# Patient Record
Sex: Male | Born: 1995
Health system: Southern US, Community
[De-identification: ages and names within clinical notes are randomized; demographics above are authoritative.]

## PROBLEM LIST (undated history)

## (undated) DIAGNOSIS — F909 Attention-deficit hyperactivity disorder, unspecified type: Secondary | ICD-10-CM

## (undated) DIAGNOSIS — M5124 Other intervertebral disc displacement, thoracic region: Secondary | ICD-10-CM

## (undated) DIAGNOSIS — F419 Anxiety disorder, unspecified: Secondary | ICD-10-CM

## (undated) HISTORY — DX: Attention-deficit hyperactivity disorder, unspecified type: F90.9

## (undated) HISTORY — PX: CIRCUMCISION: SHX1350

## (undated) HISTORY — DX: Other intervertebral disc displacement, thoracic region: M51.24

## (undated) HISTORY — DX: Anxiety disorder, unspecified: F41.9

---
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
## 2015-01-22 ENCOUNTER — Other Ambulatory Visit: Payer: Self-pay

## 2015-01-22 DIAGNOSIS — G250 Essential tremor: Secondary | ICD-10-CM

## 2015-01-22 DIAGNOSIS — G252 Other specified forms of tremor: Principal | ICD-10-CM

## 2015-01-22 MED ORDER — PROPRANOLOL HCL 10 MG PO TABS: ORAL_TABLET | ORAL | Status: DC

## 2015-02-14 ENCOUNTER — Ambulatory Visit (INDEPENDENT_AMBULATORY_CARE_PROVIDER_SITE_OTHER): Payer: BLUE CROSS/BLUE SHIELD | Admitting: Pediatrics

## 2015-02-14 ENCOUNTER — Encounter: Payer: Self-pay | Admitting: Pediatrics

## 2015-02-14 VITALS — BP 116/72 | HR 84 | Ht 70.5 in | Wt 158.4 lb

## 2015-02-14 DIAGNOSIS — G250 Essential tremor: Secondary | ICD-10-CM | POA: Diagnosis not present

## 2015-02-14 MED ORDER — PROPRANOLOL HCL 10 MG PO TABS: ORAL_TABLET | ORAL | Status: DC

## 2015-02-14 NOTE — Progress Notes (Signed)
Patient: Kent Miller MRN: [id number] Sex: male DOB: Apr 30, 1996  Provider: Deetta PerlaHICKLING,Donathan Buller H, MD [facility]  Note type: Routine return visit  History of Present Illness: Referral Source: Cynda AcresMarybeth Myers, MD [facility] Chief Complaint: ADHD/ Essential Tremor/ODD  Kent Savannahaylor Esterline is a 19 y.o. male who returns February 14, 2015, for the first time since January 17, 2014.  Kent Miller has essential tremor and a past history of attention deficit hyperactivity disorder. 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  He has family and friends who have been welders.  This is a two year program and he has good job prospects when he finishes.    Currently, he works part-time for a [employer] and was allowed time off in order to go to school.  He lives at home with his parents.  He believes that the current dose of propranolol, which is only 5 mg in the morning it is controlling his tremor to the extent that it needs to.  He does not wish higher doses.  He also has attention deficit disorder, which has been treated with Strattera by his primary physician.  His general health has been good.  He is sleeping well and his appetite is fine.  He has gained 12 pounds since his last visit, but looks well.  Review of Systems: 12 system review was unremarkable  Past Medical History Diagnosis Date  . Anxiety   . ADHD (attention deficit hyperactivity disorder)   . Disc displacement, thoracic    Hospitalizations: No., Head Injury: No., Nervous System Infections: No., Immunizations up to date: Yes.    Degenerative disc disease in T4 and T5 Oppositional defiant disorder ADD  Birth  History 6 pound 4 1/2 ounce infant born at 4337 weeks' gestational age to a 19 year old primigravida.  Gestation was complicated by a 35 pound weight gain, and borderline gestational diabetes.  Labor lasted for 6 hours.  Delivery was by cesarean section for breech presentation.  Nursery course was uncomplicated. The patient was breast fed for 6 weeks, thereafter breast milk was pumped and given in a bottle because he did not latch on well.  Growth and development was recalled as normal.  Behavior History anger  Surgical History Procedure Laterality Date  . Circumcision  1997   Family History family history includes Pancreatic cancer in his maternal grandfather. Family history is negative for migraines, seizures, intellectual disabilities, blindness, deafness, birth defects, chromosomal disorder, or autism.  Social History . Marital Status: Single    Spouse Name: N/A  . Number of Children: N/A  . Years of Education: N/A   Social History Main Topics  . Smoking status: Never Smoker   . Smokeless tobacco: Never Used  . Alcohol Use: No  . Drug Use: No  . 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 He lives with his parents and sibling. He enjoys hunting, welding, and outdoor activities.   Allergies Allergen Reactions  . Other     Seasonal Allergies- Trees, Grass, Dust Mites   Physical Exam BP 116/72 mmHg  Pulse 84  Ht 5' 10.5" (1.791 m)  Wt 158 lb 6.4 oz (71.85 kg)  BMI 22.40 kg/m2  General: alert, well developed, well nourished, in no acute distress, brown hair, green eyes, right handed Head: normocephalic, no dysmorphic features Ears, Nose and Throat: Otoscopic: tympanic membranes normal; pharynx: oropharynx is pink without exudates or tonsillar hypertrophy Neck: supple, full range of motion Respiratory: auscultation  clear Cardiovascular: no murmurs, pulses are normal Musculoskeletal: no skeletal deformities or apparent scoliosis Skin: no rashes or neurocutaneous lesions  Neurologic Exam  Mental Status: alert; oriented to person, place and year; knowledge is normal for age; language is normal Cranial Nerves: visual fields are full to double simultaneous stimuli; extraocular movements are full and conjugate; pupils are around reactive to light; funduscopic examination shows sharp disc margins with normal vessels; symmetric facial strength; midline tongue and uvula Motor: Normal strength, tone and mass; good fine motor movements; no pronator drift, mild visible tremor when his hands are extended from his side Coordination: good finger-to-nose, rapid repetitive alternating movements and finger apposition Gait and Station: normal gait and station, balance is adequate Reflexes: symmetric and normal bilaterally; no clonus  Assessment 1. Essential tremor, G25.0. 2. Attention deficit disorder, combined type.  Discussion I am pleased that he is feeling well and that his tremor is minimal.  There is no reason to change it at this time.  Plan He will return to see me in one year's time.  I will refill his prescription in six months.  I spent 30 minutes of face-to-face time with Kent Miller, more than half of it in consultation.   Medication List   This list is accurate as of: 02/14/15  2:45 PM.       ibuprofen 200 MG tablet  Commonly known as:  ADVIL,MOTRIN  Take 200 mg by mouth once as needed for pain.     naproxen 500 MG tablet  Commonly known as:  NAPROSYN  Take 500 mg by mouth 2 (two) times daily as needed (pain).     propranolol 10 MG tablet  Commonly known as:  INDERAL  Take 1/2 tablet daily     STRATTERA 80 MG capsule  Generic drug:  atomoxetine  Take 80 mg by mouth daily. 1 tablet by mouth daily Brand name medically necessary      The medication list was reviewed and reconciled. All changes  or newly prescribed medications were explained.  A complete medication list was provided to the patient/caregiver.  Deetta Perla MD

## 2015-08-29 ENCOUNTER — Other Ambulatory Visit: Payer: Self-pay | Admitting: Pediatrics

## 2017-09-04 DIAGNOSIS — L255 Unspecified contact dermatitis due to plants, except food: Secondary | ICD-10-CM | POA: Diagnosis not present

## 2017-09-22 DIAGNOSIS — L600 Ingrowing nail: Secondary | ICD-10-CM | POA: Diagnosis not present

## 2018-02-03 DIAGNOSIS — L255 Unspecified contact dermatitis due to plants, except food: Secondary | ICD-10-CM | POA: Diagnosis not present

## 2018-04-07 DIAGNOSIS — M542 Cervicalgia: Secondary | ICD-10-CM | POA: Diagnosis not present

## 2018-04-07 DIAGNOSIS — M5416 Radiculopathy, lumbar region: Secondary | ICD-10-CM | POA: Diagnosis not present

## 2018-04-07 DIAGNOSIS — M546 Pain in thoracic spine: Secondary | ICD-10-CM | POA: Diagnosis not present

## 2018-04-07 DIAGNOSIS — M545 Low back pain: Secondary | ICD-10-CM | POA: Diagnosis not present

## 2018-04-07 DIAGNOSIS — M439 Deforming dorsopathy, unspecified: Secondary | ICD-10-CM | POA: Diagnosis not present

## 2018-05-06 DIAGNOSIS — K219 Gastro-esophageal reflux disease without esophagitis: Secondary | ICD-10-CM | POA: Diagnosis not present

## 2019-02-16 DIAGNOSIS — R1084 Generalized abdominal pain: Secondary | ICD-10-CM | POA: Diagnosis not present

## 2019-02-17 ENCOUNTER — Ambulatory Visit (INDEPENDENT_AMBULATORY_CARE_PROVIDER_SITE_OTHER): Payer: BC Managed Care – PPO

## 2019-02-17 ENCOUNTER — Encounter: Payer: Self-pay | Admitting: Sports Medicine

## 2019-02-17 ENCOUNTER — Other Ambulatory Visit: Payer: Self-pay

## 2019-02-17 ENCOUNTER — Ambulatory Visit (INDEPENDENT_AMBULATORY_CARE_PROVIDER_SITE_OTHER): Payer: BC Managed Care – PPO | Admitting: Sports Medicine

## 2019-02-17 DIAGNOSIS — R109 Unspecified abdominal pain: Secondary | ICD-10-CM | POA: Diagnosis not present

## 2019-02-17 DIAGNOSIS — R1032 Left lower quadrant pain: Secondary | ICD-10-CM

## 2019-02-17 DIAGNOSIS — R102 Pelvic and perineal pain: Secondary | ICD-10-CM | POA: Diagnosis not present

## 2019-02-17 MED ORDER — IOPAMIDOL (ISOVUE-300) INJECTION 61%
100.0000 mL | Freq: Once | INTRAVENOUS | Status: AC | PRN
  Administered 2019-02-17: 15:00:00 100 mL via INTRAVENOUS

## 2019-02-17 NOTE — Progress Notes (Signed)
Subjective:    CC: Abdominal pain  HPI:  This is a pleasant 23 year old male, earlier this week he was trying to lift a dear carcass, he felt immediate pain in his upper abdomen, as well as down in his left groin.  He did notice a bulge in his upper abdomen.  Pain is moderate, persistent, localized without radiation.  No nausea, vomiting or diarrhea, no melena, hematochezia.  I reviewed the past medical history, family history, social history, surgical history, and allergies today and no changes were needed.  Please see the problem list section below in epic for further details.  Past Medical History: Past Medical History:  Diagnosis Date  . ADHD (attention deficit hyperactivity disorder)   . Anxiety   . Disc displacement, thoracic    Past Surgical History: Past Surgical History:  Procedure Laterality Date  . CIRCUMCISION  1997   Social History: Social History   Socioeconomic History  . Marital status: Single    Spouse name: Not on file  . Number of children: Not on file  . Years of education: Not on file  . Highest education level: Not on file  Occupational History  . Not on file  Social Needs  . Financial resource strain: Not on file  . Food insecurity    Worry: Not on file    Inability: Not on file  . Transportation needs    Medical: Not on file    Non-medical: Not on file  Tobacco Use  . Smoking status: Never Smoker  . Smokeless tobacco: Never Used  Substance and Sexual Activity  . Alcohol use: No    Alcohol/week: 0.0 standard drinks  . Drug use: No  . Sexual activity: Yes  Lifestyle  . Physical activity    Days per week: Not on file    Minutes per session: Not on file  . Stress: Not on file  Relationships  . Social Musician on phone: Not on file    Gets together: Not on file    Attends religious service: Not on file    Active member of club or organization: Not on file    Attends meetings of clubs or organizations: Not on file   Relationship status: Not on file  Other Topics Concern  . 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 He lives with his parents and sibling. He enjoys hunting, welding, and outdoor activities.   Family History: Family History  Problem Relation Age of Onset  . Pancreatic cancer Maternal Grandfather    Allergies: Allergies  Allergen Reactions  . Other     Seasonal Allergies- Trees, Grass, Dust Mites   Medications: See med rec.  Review of Systems: No headache, visual changes, nausea, vomiting, diarrhea, constipation, dizziness, abdominal pain, skin rash, fevers, chills, night sweats, swollen lymph nodes, weight loss, chest pain, body aches, joint swelling, muscle aches, shortness of breath, mood changes, visual or auditory hallucinations.  Objective:    General: Well Developed, well nourished, and in no acute distress.  Neuro: Alert and oriented x3, extra-ocular muscles intact, sensation grossly intact.  HEENT: Normocephalic, atraumatic, pupils equal round reactive to light, neck supple, no masses, no lymphadenopathy, thyroid nonpalpable.  Skin: Warm and dry, no rashes noted.  Cardiac: Regular rate and rhythm, no murmurs rubs or gallops.  Respiratory: Clear to auscultation bilaterally. Not using accessory muscles, speaking in  full sentences.  Abdominal: Soft, minimally tender in the epigastrium, nondistended, positive bowel sounds, no masses, no organomegaly.  Genital: No penile lesions, testicles unremarkable, no palpable masses, no hydrocele or varicocele, no palpable bulges with Valsalva in either of the internal rings. Musculoskeletal: Shoulder, elbow, wrist, hip, knee, ankle stable, and with full range of motion.  Impression and Recommendations:    The patient was counselled, risk factors were discussed, anticipatory guidance given.  Acute abdominal pain  Though I think there may have been an abdominal wall strain, we are going to go ahead and CT scan his belly with oral and IV contrast. His hernia exam was unremarkable. He should limit lifting to 10 pounds for the next week.   ___________________________________________ Gwen Her. Dianah Field, M.D., ABFM., CAQSM. Primary Care and Sports Medicine [facility]  Adjunct Professor of [employer] [facility] [employer]

## 2019-02-17 NOTE — Assessment & Plan Note (Addendum)
Though I think there may have been an abdominal wall strain, we are going to go ahead and CT scan his belly with oral and IV contrast. His hernia exam was unremarkable. He should limit lifting to 10 pounds for the next week.

## 2019-03-01 ENCOUNTER — Ambulatory Visit (INDEPENDENT_AMBULATORY_CARE_PROVIDER_SITE_OTHER): Payer: BC Managed Care – PPO | Admitting: Sports Medicine

## 2019-03-01 DIAGNOSIS — R109 Unspecified abdominal pain: Secondary | ICD-10-CM

## 2019-03-01 NOTE — Progress Notes (Signed)
Virtual Visit via Telephone   I connected with  Kent Miller  on 03/01/19 by telephone/telehealth and verified that I am speaking with the correct person using two identifiers.   I discussed the limitations, risks, security and privacy concerns of performing an evaluation and management service by telephone, including the higher likelihood of inaccurate diagnosis and treatment, and the availability of in person appointments.  We also discussed the likely need of an additional face to face encounter for complete and high quality delivery of care.  I also discussed with the patient that there may be a patient responsible charge related to this service. The patient expressed understanding and wishes to proceed.  Provider location is either at home or medical facility. Patient location is at their home, different from provider location. People involved in care of the patient during this telehealth encounter were myself, my nurse/medical assistant, and my front office/scheduling team member.  Subjective:    CC: Follow-up  HPI: Abdominal pain: Completely resolved now.  I reviewed the past medical history, family history, social history, surgical history, and allergies today and no changes were needed.  Please see the problem list section below in epic for further details.  Past Medical History: Past Medical History:  Diagnosis Date  . ADHD (attention deficit hyperactivity disorder)   . Anxiety   . Disc displacement, thoracic    Past Surgical History: Past Surgical History:  Procedure Laterality Date  . CIRCUMCISION  1997   Social History: Social History   Socioeconomic History  . Marital status: Single    Spouse name: Not on file  . Number of children: Not on file  . Years of education: Not on file  . Highest education level: Not on file  Occupational History  . Not on file  Social Needs  . Financial resource strain: Not on file  . Food insecurity    Worry: Not on file   Inability: Not on file  . Transportation needs    Medical: Not on file    Non-medical: Not on file  Tobacco Use  . Smoking status: Never Smoker  . Smokeless tobacco: Never Used  Substance and Sexual Activity  . Alcohol use: No    Alcohol/week: 0.0 standard drinks  . Drug use: No  . Sexual activity: Yes  Lifestyle  . Physical activity    Days per week: Not on file    Minutes per session: Not on file  . Stress: Not on file  Relationships  . Social Herbalist on phone: Not on file    Gets together: Not on file    Attends religious service: Not on file    Active member of club or organization: Not on file    Attends meetings of clubs or organizations: Not on file    Relationship status: Not on file  Other Topics Concern  . 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 He lives with his parents and sibling. He enjoys hunting, welding, and outdoor activities.   Family History: Family History  Problem Relation Age of Onset  . Pancreatic cancer Maternal Grandfather    Allergies: Allergies  Allergen Reactions  . Other     Seasonal Allergies- Trees, Grass, Dust Mites   Medications: See med rec.  Review of Systems: No fevers, chills, night sweats, weight loss, chest pain, or shortness of breath.  Objective:    General: Speaking full sentences, no audible heavy breathing.  Sounds alert and appropriately interactive.  No other physical exam performed due to the non-face to face nature of this visit.  Impression and Recommendations:    Acute abdominal pain CT was negative, presumptive diagnosis with abdominal wall strain, he is now pain-free.  I discussed the above assessment and treatment plan with the patient. The patient was provided an opportunity to ask questions and all were answered. The patient agreed with the plan and demonstrated an  understanding of the instructions.   The patient was advised to call back or seek an in-person evaluation if the symptoms worsen or if the condition fails to improve as anticipated.   I provided 15 minutes of non-face-to-face time during this encounter, 15 minutes of additional time was needed to gather information, review chart, records, communicate/coordinate with staff remotely, and complete documentation.   ___________________________________________ Ihor Austin. Benjamin Stain, M.D., ABFM., CAQSM. Primary Care and Sports Medicine [facility]  Adjunct Professor of Family Medicine  [employer]

## 2019-03-01 NOTE — Assessment & Plan Note (Signed)
CT was negative, presumptive diagnosis with abdominal wall strain, he is now pain-free.

## 2019-12-29 DIAGNOSIS — K2100 Gastro-esophageal reflux disease with esophagitis, without bleeding: Secondary | ICD-10-CM | POA: Diagnosis not present

## 2020-02-11 DIAGNOSIS — B86 Scabies: Secondary | ICD-10-CM | POA: Diagnosis not present

## 2020-02-22 DIAGNOSIS — B880 Other acariasis: Secondary | ICD-10-CM | POA: Diagnosis not present

## 2020-02-22 DIAGNOSIS — B3742 Candidal balanitis: Secondary | ICD-10-CM | POA: Diagnosis not present

## 2020-02-22 DIAGNOSIS — R309 Painful micturition, unspecified: Secondary | ICD-10-CM | POA: Diagnosis not present

## 2020-03-07 DIAGNOSIS — L299 Pruritus, unspecified: Secondary | ICD-10-CM | POA: Diagnosis not present

## 2020-03-07 DIAGNOSIS — L209 Atopic dermatitis, unspecified: Secondary | ICD-10-CM | POA: Diagnosis not present

## 2020-05-14 DIAGNOSIS — Z3169 Encounter for other general counseling and advice on procreation: Secondary | ICD-10-CM | POA: Diagnosis not present

## 2020-05-27 DIAGNOSIS — Z3141 Encounter for fertility testing: Secondary | ICD-10-CM | POA: Diagnosis not present

## 2020-06-10 DIAGNOSIS — N4601 Organic azoospermia: Secondary | ICD-10-CM | POA: Diagnosis not present

## 2020-06-10 DIAGNOSIS — E291 Testicular hypofunction: Secondary | ICD-10-CM | POA: Diagnosis not present

## 2020-06-19 DIAGNOSIS — Z3141 Encounter for fertility testing: Secondary | ICD-10-CM | POA: Diagnosis not present

## 2020-06-26 DIAGNOSIS — L209 Atopic dermatitis, unspecified: Secondary | ICD-10-CM | POA: Diagnosis not present

## 2020-07-08 DIAGNOSIS — N4601 Organic azoospermia: Secondary | ICD-10-CM | POA: Diagnosis not present

## 2020-07-08 DIAGNOSIS — E291 Testicular hypofunction: Secondary | ICD-10-CM | POA: Diagnosis not present

## 2020-08-15 DIAGNOSIS — Z113 Encounter for screening for infections with a predominantly sexual mode of transmission: Secondary | ICD-10-CM | POA: Diagnosis not present

## 2020-08-15 DIAGNOSIS — Z31440 Encounter of male for testing for genetic disease carrier status for procreative management: Secondary | ICD-10-CM | POA: Diagnosis not present

## 2020-09-02 DIAGNOSIS — N4601 Organic azoospermia: Secondary | ICD-10-CM | POA: Diagnosis not present

## 2020-10-02 DIAGNOSIS — Q984 Klinefelter syndrome, unspecified: Secondary | ICD-10-CM | POA: Diagnosis not present

## 2020-11-22 DIAGNOSIS — Q984 Klinefelter syndrome, unspecified: Secondary | ICD-10-CM | POA: Diagnosis not present

## 2020-12-03 DIAGNOSIS — L299 Pruritus, unspecified: Secondary | ICD-10-CM | POA: Diagnosis not present

## 2020-12-03 DIAGNOSIS — L209 Atopic dermatitis, unspecified: Secondary | ICD-10-CM | POA: Diagnosis not present

## 2020-12-25 DIAGNOSIS — F4322 Adjustment disorder with anxiety: Secondary | ICD-10-CM | POA: Diagnosis not present

## 2021-04-23 DIAGNOSIS — E291 Testicular hypofunction: Secondary | ICD-10-CM | POA: Diagnosis not present

## 2021-04-23 DIAGNOSIS — Z79899 Other long term (current) drug therapy: Secondary | ICD-10-CM | POA: Diagnosis not present

## 2021-04-23 DIAGNOSIS — Q984 Klinefelter syndrome, unspecified: Secondary | ICD-10-CM | POA: Diagnosis not present

## 2021-05-08 DIAGNOSIS — F4322 Adjustment disorder with anxiety: Secondary | ICD-10-CM | POA: Diagnosis not present

## 2021-05-08 IMAGING — CT CT ABD-PELV W/ CM
2 of 4 series · 16 of 46 positions shown, 18 images · IV contrast (APPLIED)
Comparison: None.

CLINICAL DATA: Left lower quadrant and groin pain for 5 days.

EXAM:
CT ABDOMEN AND PELVIS WITH CONTRAST
TECHNIQUE: Multidetector CT imaging of the abdomen and pelvis was performed
using the standard protocol following bolus administration of
intravenous contrast.
CONTRAST:  100mL WQQUAI-H44 IOPAMIDOL (WQQUAI-H44) INJECTION 61%

[Series 2: axial st · axial · 0.82mm/px · z∈[-551,-76]mm · 13 of 103 slices shown, 15 images]
[im 4/103  soft-tissue]
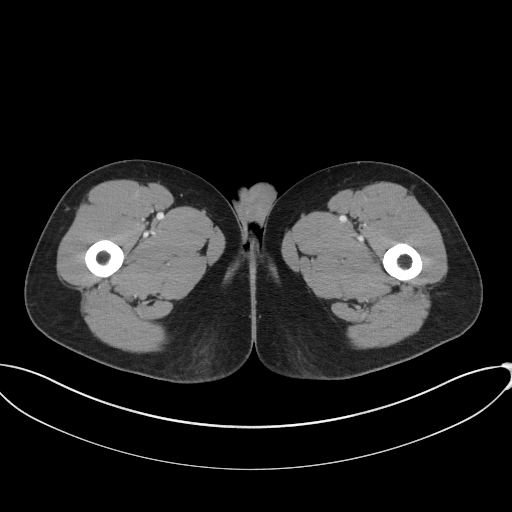
[im 4/103  bone]
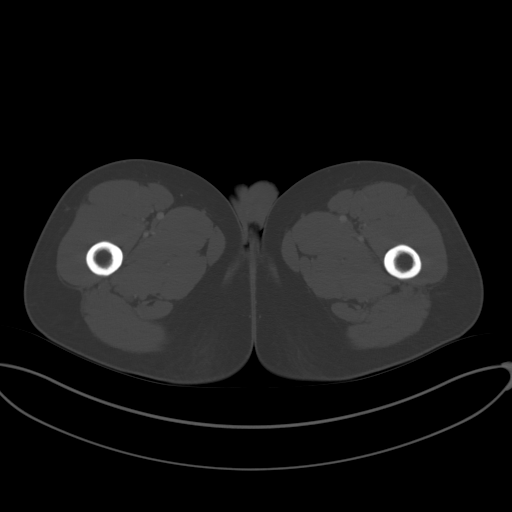
[im 12/103  soft-tissue]
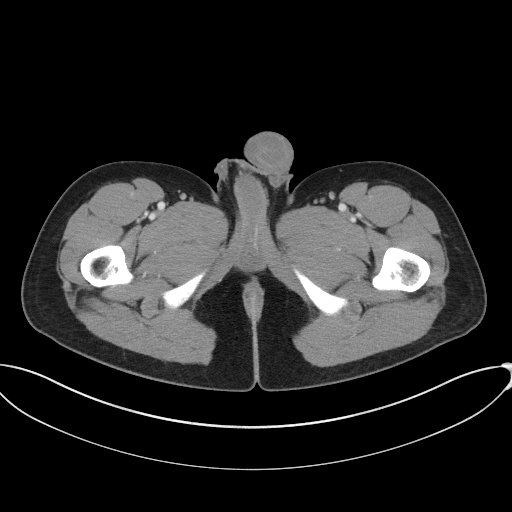
[im 20/103  soft-tissue]
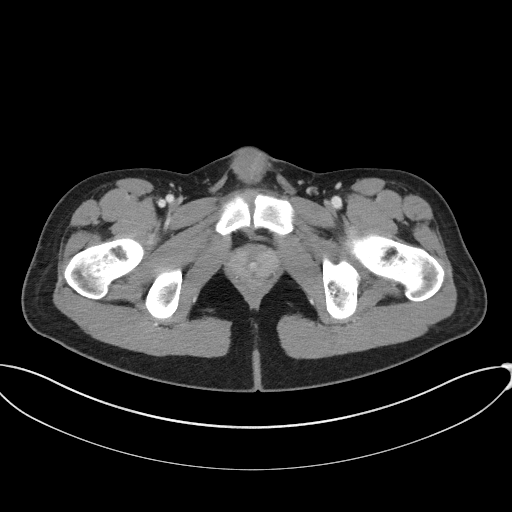
[im 28/103  soft-tissue]
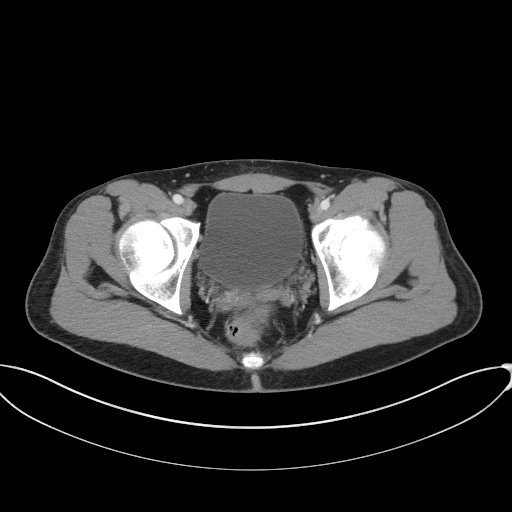
[im 36/103  soft-tissue]
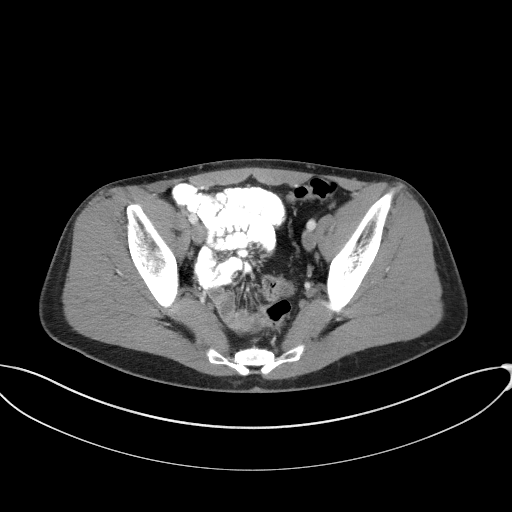
[im 44/103  soft-tissue]
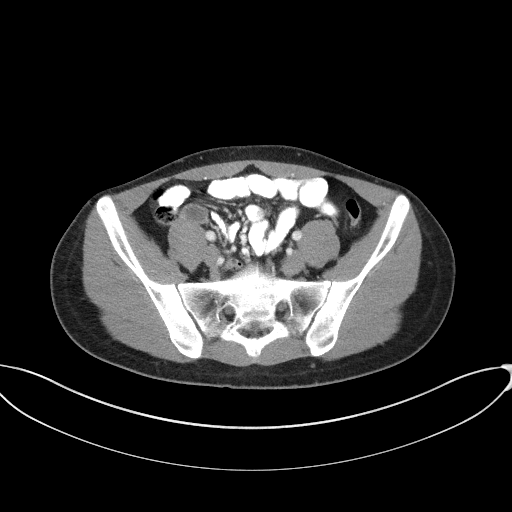
[im 52/103  soft-tissue]
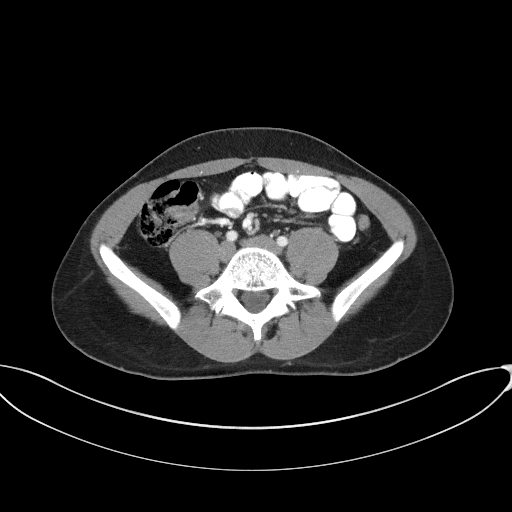
[im 59/103  soft-tissue]
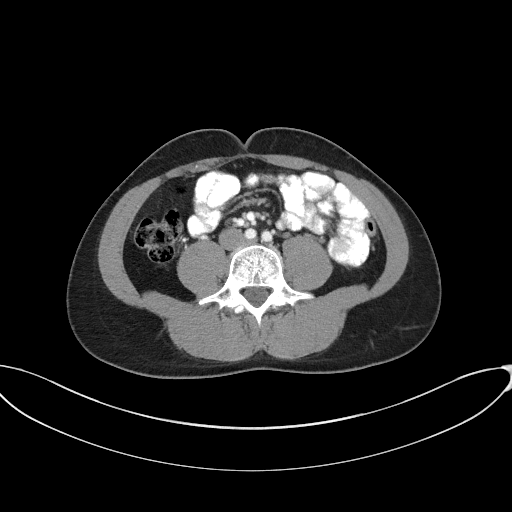
[im 67/103  soft-tissue]
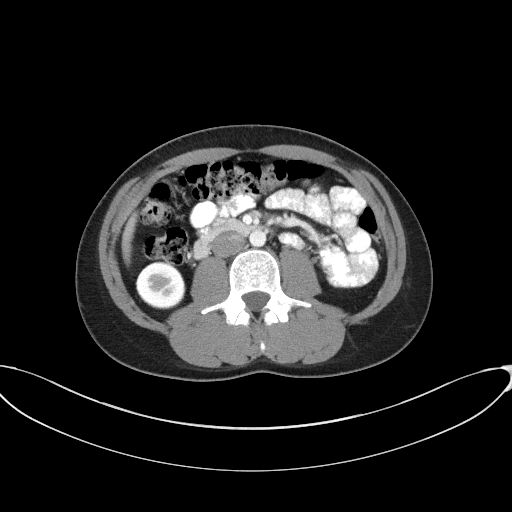
[im 67/103  bone]
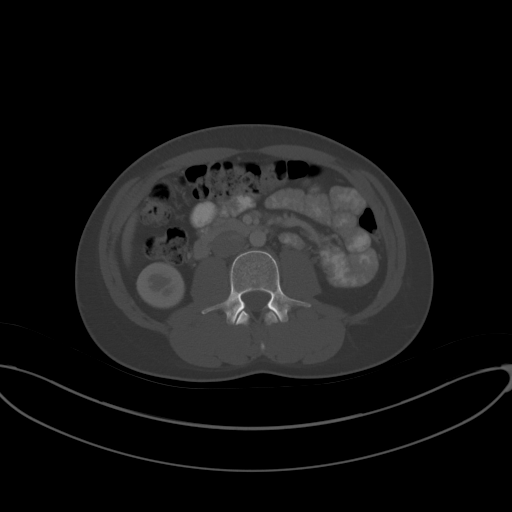
[im 75/103  soft-tissue]
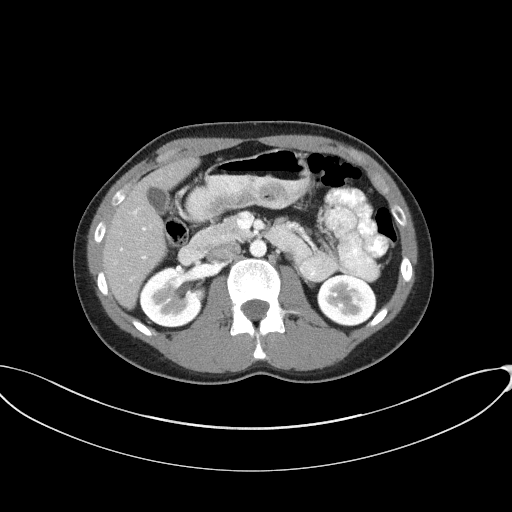
[im 83/103  soft-tissue]
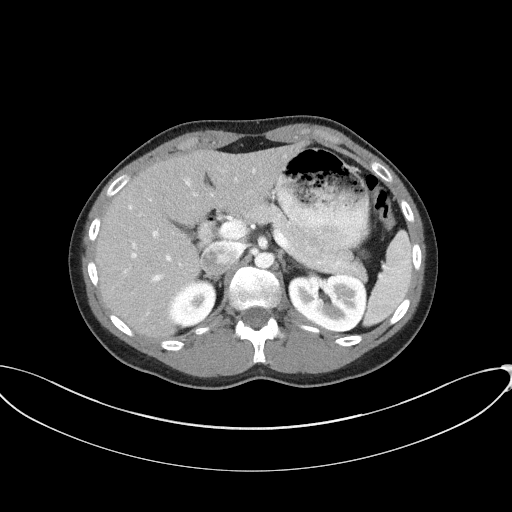
[im 91/103  soft-tissue]
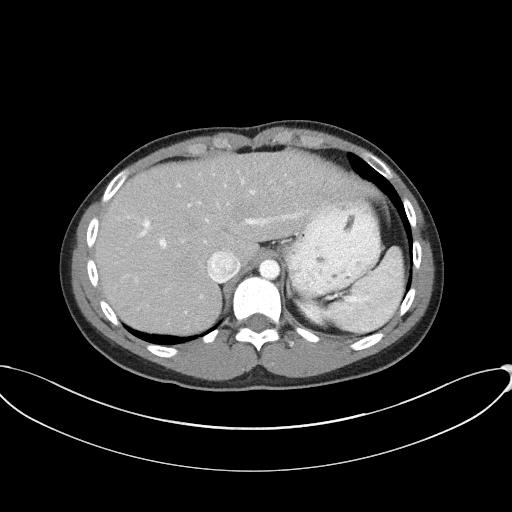
[im 99/103  soft-tissue]
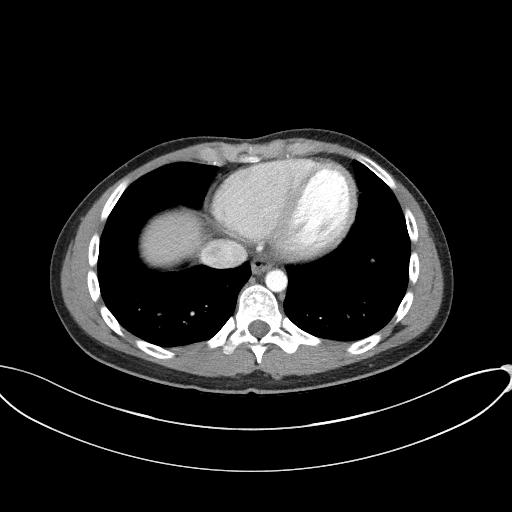

[Series 5: coronal st · coronal · 0.74mm/px · 3 of 80 slices shown]
[im 27/80  soft-tissue]
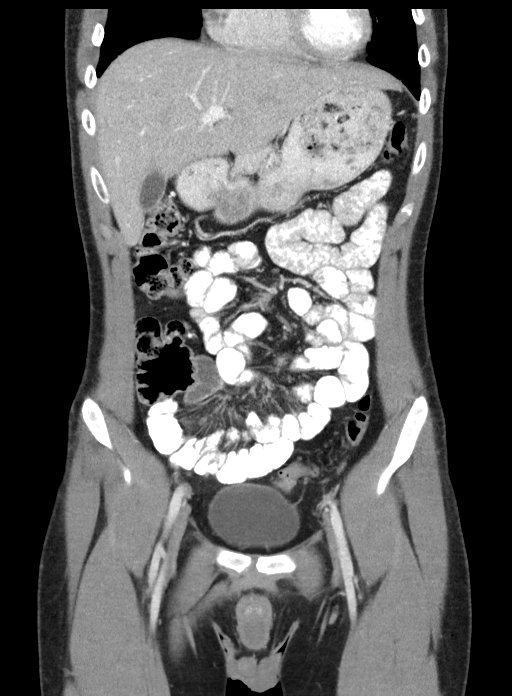
[im 36/80  soft-tissue]
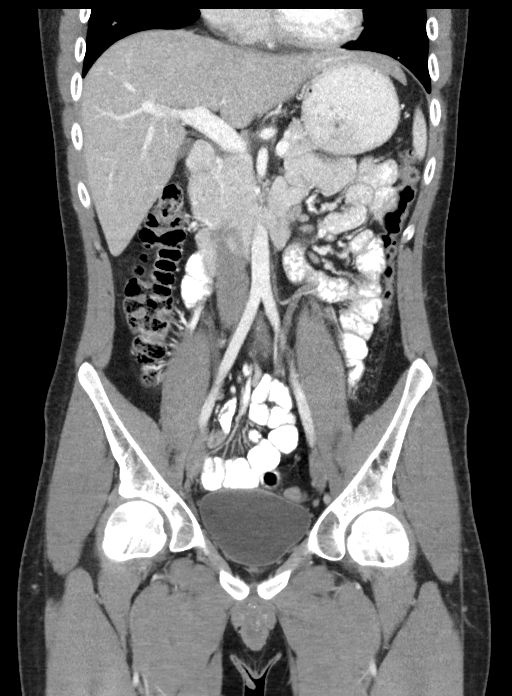
[im 44/80  soft-tissue]
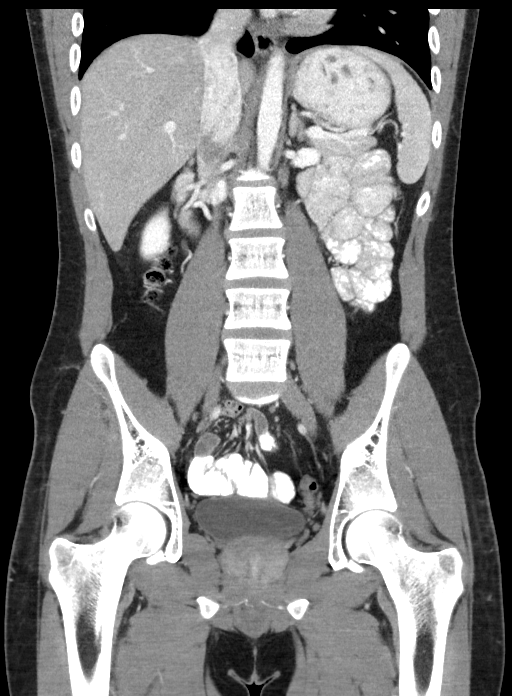

[16 of 46 positions shown; findings below may reference images not displayed]

FINDINGS: Lower Chest: No acute findings.

Hepatobiliary: No hepatic masses identified. Gallbladder is
unremarkable. No evidence of biliary ductal dilatation.

Pancreas:  No mass or inflammatory changes.

Spleen: Within normal limits in size and appearance.

Adrenals/Urinary Tract: No masses identified. No evidence of
hydronephrosis.

Stomach/Bowel: No evidence of obstruction, inflammatory process or
abnormal fluid collections. Normal appendix visualized.

Vascular/Lymphatic: No pathologically enlarged lymph nodes. No
abdominal aortic aneurysm.

Reproductive:  No mass or other significant abnormality.

Other:  No evidence of inguinal or ventral abdominal wall hernia.

Musculoskeletal:  No suspicious bone lesions identified.
IMPRESSION: Negative. No acute findings or other significant abnormality.

## 2021-05-15 DIAGNOSIS — F4322 Adjustment disorder with anxiety: Secondary | ICD-10-CM | POA: Diagnosis not present

## 2021-05-27 DIAGNOSIS — H6691 Otitis media, unspecified, right ear: Secondary | ICD-10-CM | POA: Diagnosis not present

## 2021-06-17 DIAGNOSIS — L74519 Primary focal hyperhidrosis, unspecified: Secondary | ICD-10-CM | POA: Diagnosis not present

## 2021-06-17 DIAGNOSIS — L209 Atopic dermatitis, unspecified: Secondary | ICD-10-CM | POA: Diagnosis not present

## 2021-09-24 DIAGNOSIS — X58XXXA Exposure to other specified factors, initial encounter: Secondary | ICD-10-CM | POA: Diagnosis not present

## 2021-09-24 DIAGNOSIS — S058X2A Other injuries of left eye and orbit, initial encounter: Secondary | ICD-10-CM | POA: Diagnosis not present

## 2021-09-24 DIAGNOSIS — H1032 Unspecified acute conjunctivitis, left eye: Secondary | ICD-10-CM | POA: Diagnosis not present

## 2021-10-02 DIAGNOSIS — H109 Unspecified conjunctivitis: Secondary | ICD-10-CM | POA: Diagnosis not present

## 2021-10-08 DIAGNOSIS — Q984 Klinefelter syndrome, unspecified: Secondary | ICD-10-CM | POA: Diagnosis not present
